# Patient Record
Sex: Male | Born: 2005 | Race: White | Hispanic: No | Marital: Single | Smoking: Never smoker
Health system: Southern US, Community
[De-identification: ages and names within clinical notes are randomized; demographics above are authoritative.]

---
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
## 2023-07-11 ENCOUNTER — Other Ambulatory Visit (HOSPITAL_COMMUNITY): Payer: Self-pay | Admitting: Pediatrics

## 2023-07-11 DIAGNOSIS — N5089 Other specified disorders of the male genital organs: Secondary | ICD-10-CM

## 2023-07-15 ENCOUNTER — Ambulatory Visit (HOSPITAL_COMMUNITY)
Admission: RE | Admit: 2023-07-15 | Discharge: 2023-07-15 | Disposition: A | Discharge status: Home / Self Care | Payer: Managed Care, Other (non HMO) | Source: Ambulatory Visit | Attending: Pediatrics | Admitting: Pediatrics

## 2023-07-15 DIAGNOSIS — N5089 Other specified disorders of the male genital organs: Secondary | ICD-10-CM | POA: Diagnosis not present
